# Patient Record
Sex: Female | Born: 1955
Health system: Southern US, Community
[De-identification: ages and names within clinical notes are randomized; demographics above are authoritative.]

## PROBLEM LIST (undated history)

## (undated) HISTORY — PX: TUBAL LIGATION: SHX77

## (undated) HISTORY — PX: APPENDECTOMY: SHX54

---

## 2009-05-01 ENCOUNTER — Ambulatory Visit: Payer: Self-pay | Admitting: Ophthalmology

## 2010-05-14 ENCOUNTER — Ambulatory Visit: Payer: Self-pay | Admitting: Ophthalmology

## 2010-05-28 ENCOUNTER — Ambulatory Visit: Payer: Self-pay | Admitting: Ophthalmology

## 2010-08-22 ENCOUNTER — Ambulatory Visit: Payer: Self-pay | Admitting: Ophthalmology

## 2011-12-31 ENCOUNTER — Ambulatory Visit: Payer: Self-pay | Admitting: Family Medicine

## 2013-12-27 IMAGING — CR RIGHT FOOT COMPLETE - 3+ VIEW
1 series · 3 of 3 positions shown · non-contrast
Comparison: none

REASON FOR EXAM: pain s/p fall
COMMENTS:

[Series 1: ap · 0.17mm/px · 3 of 3 slices shown]
[im 1/3]
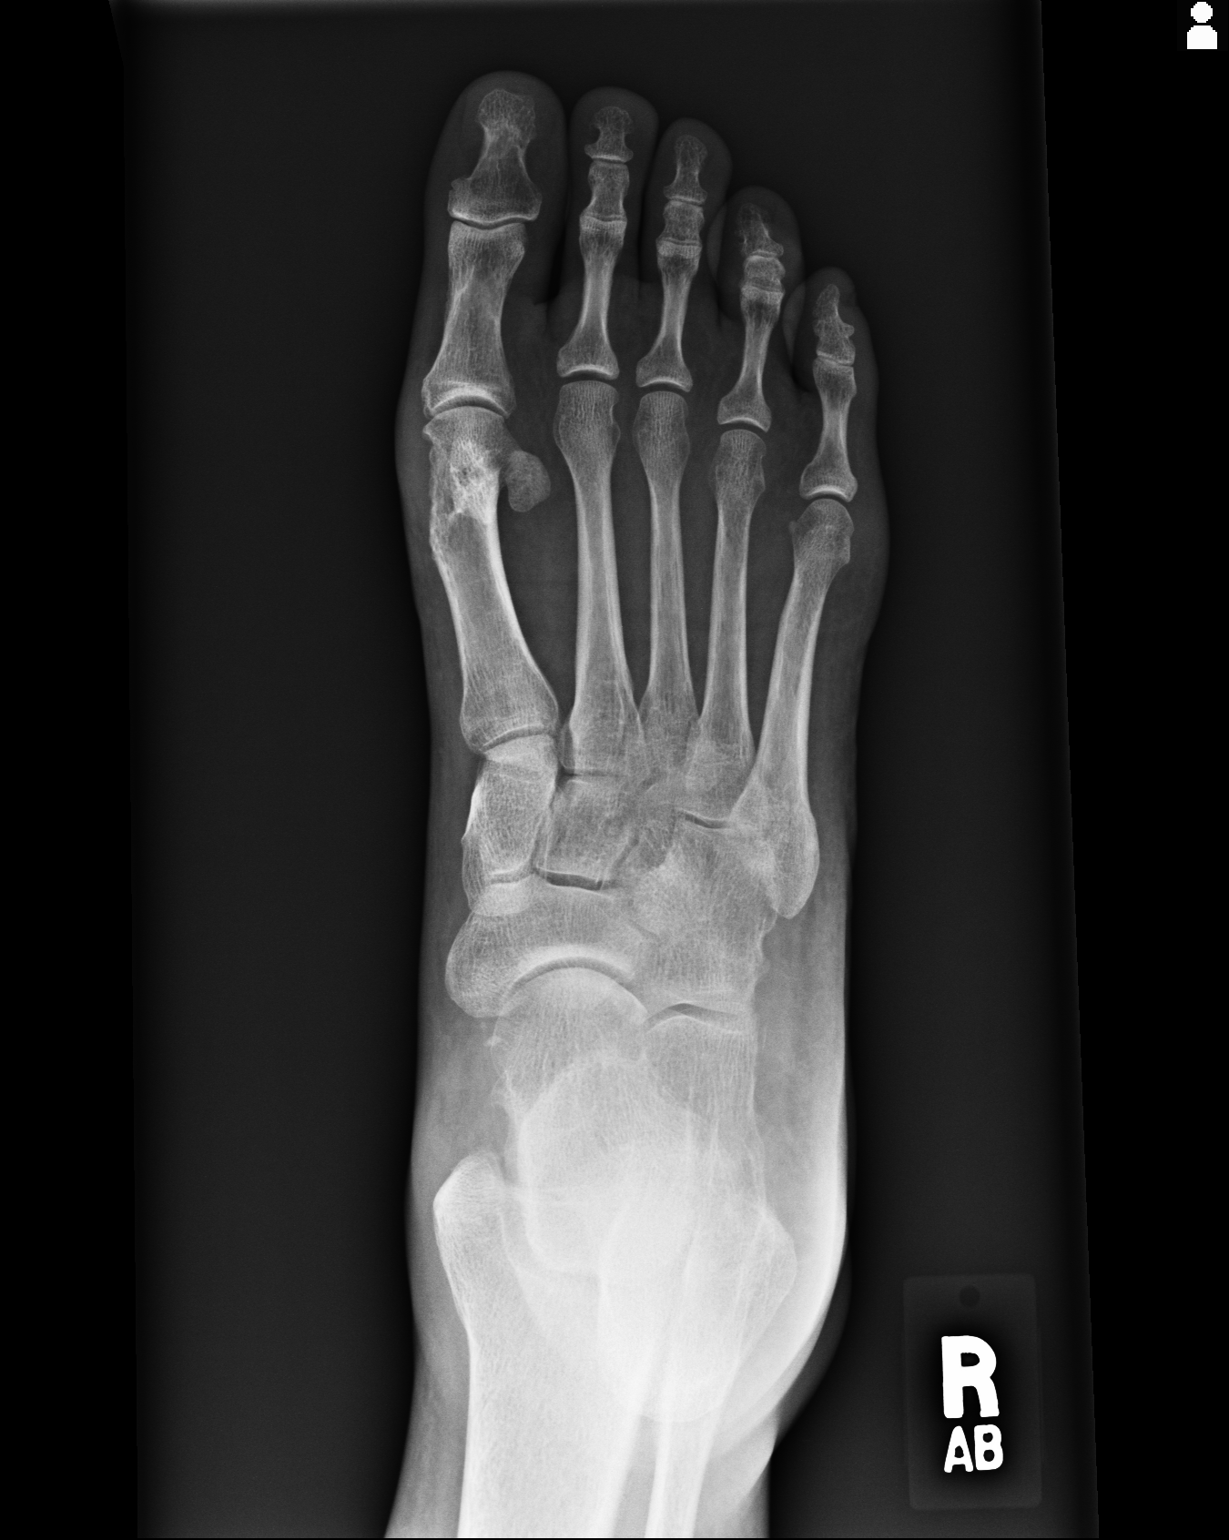
[im 2/3]
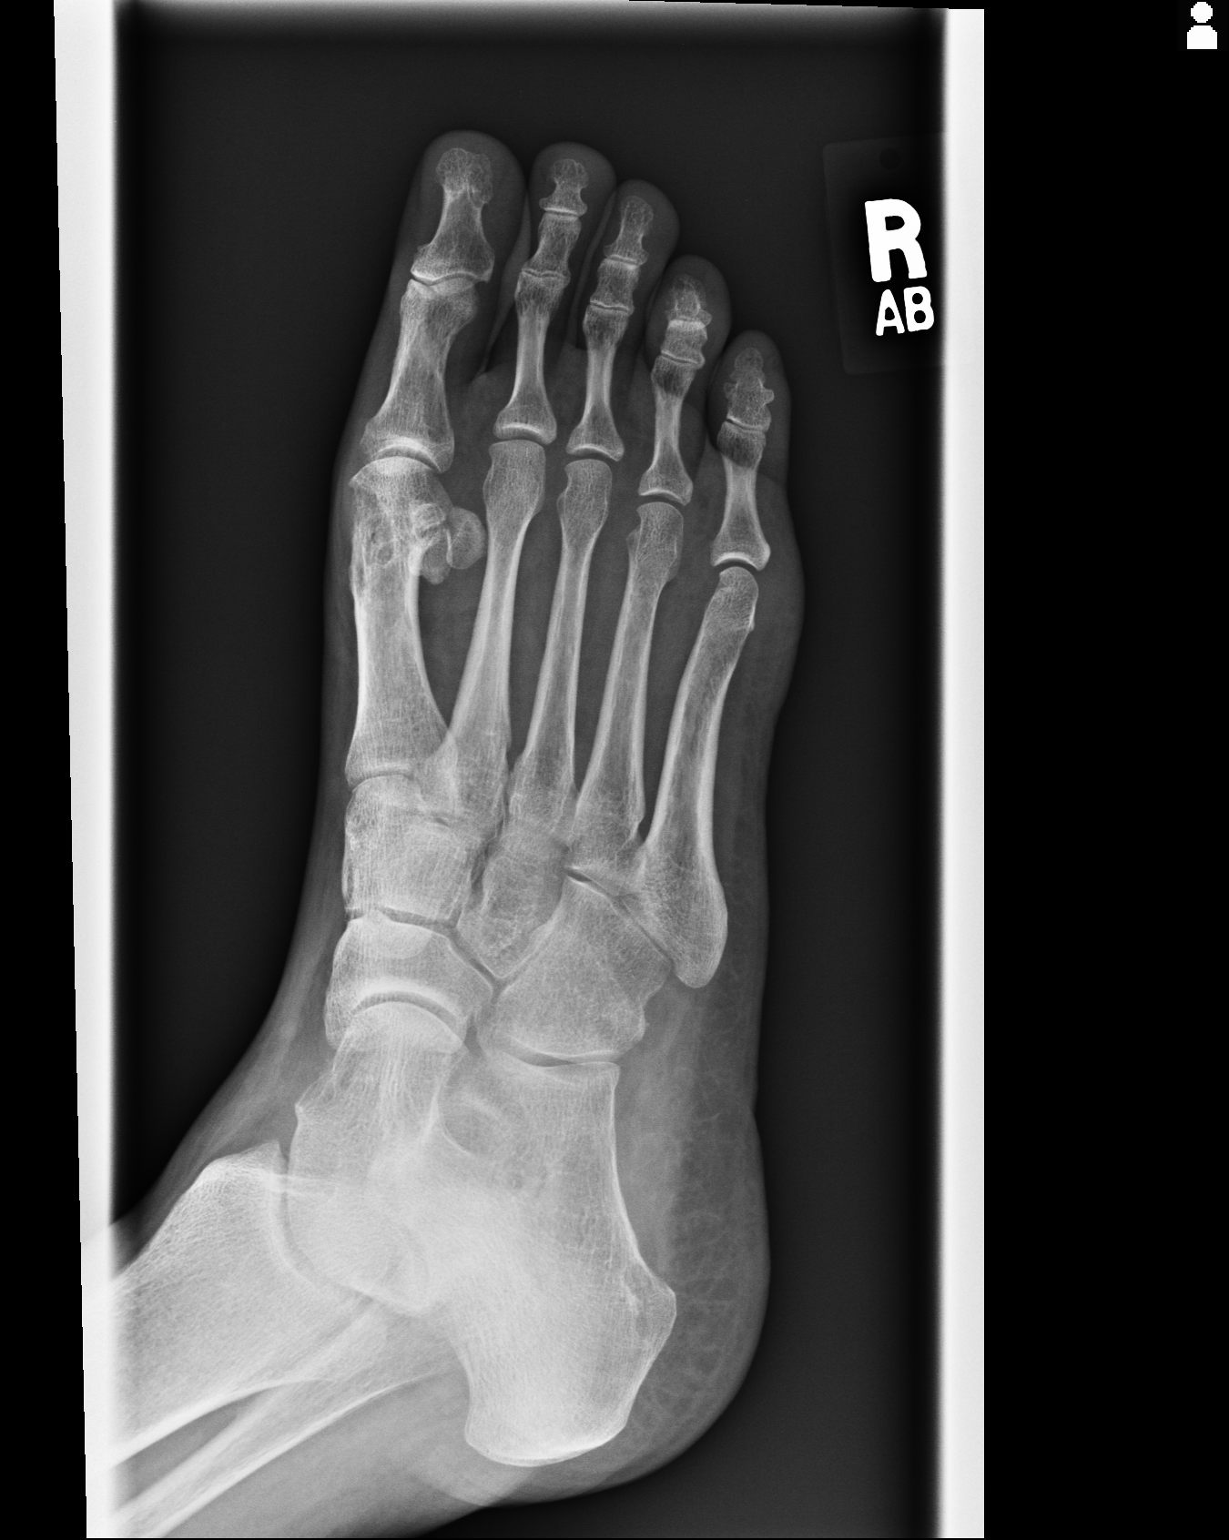
[im 3/3]
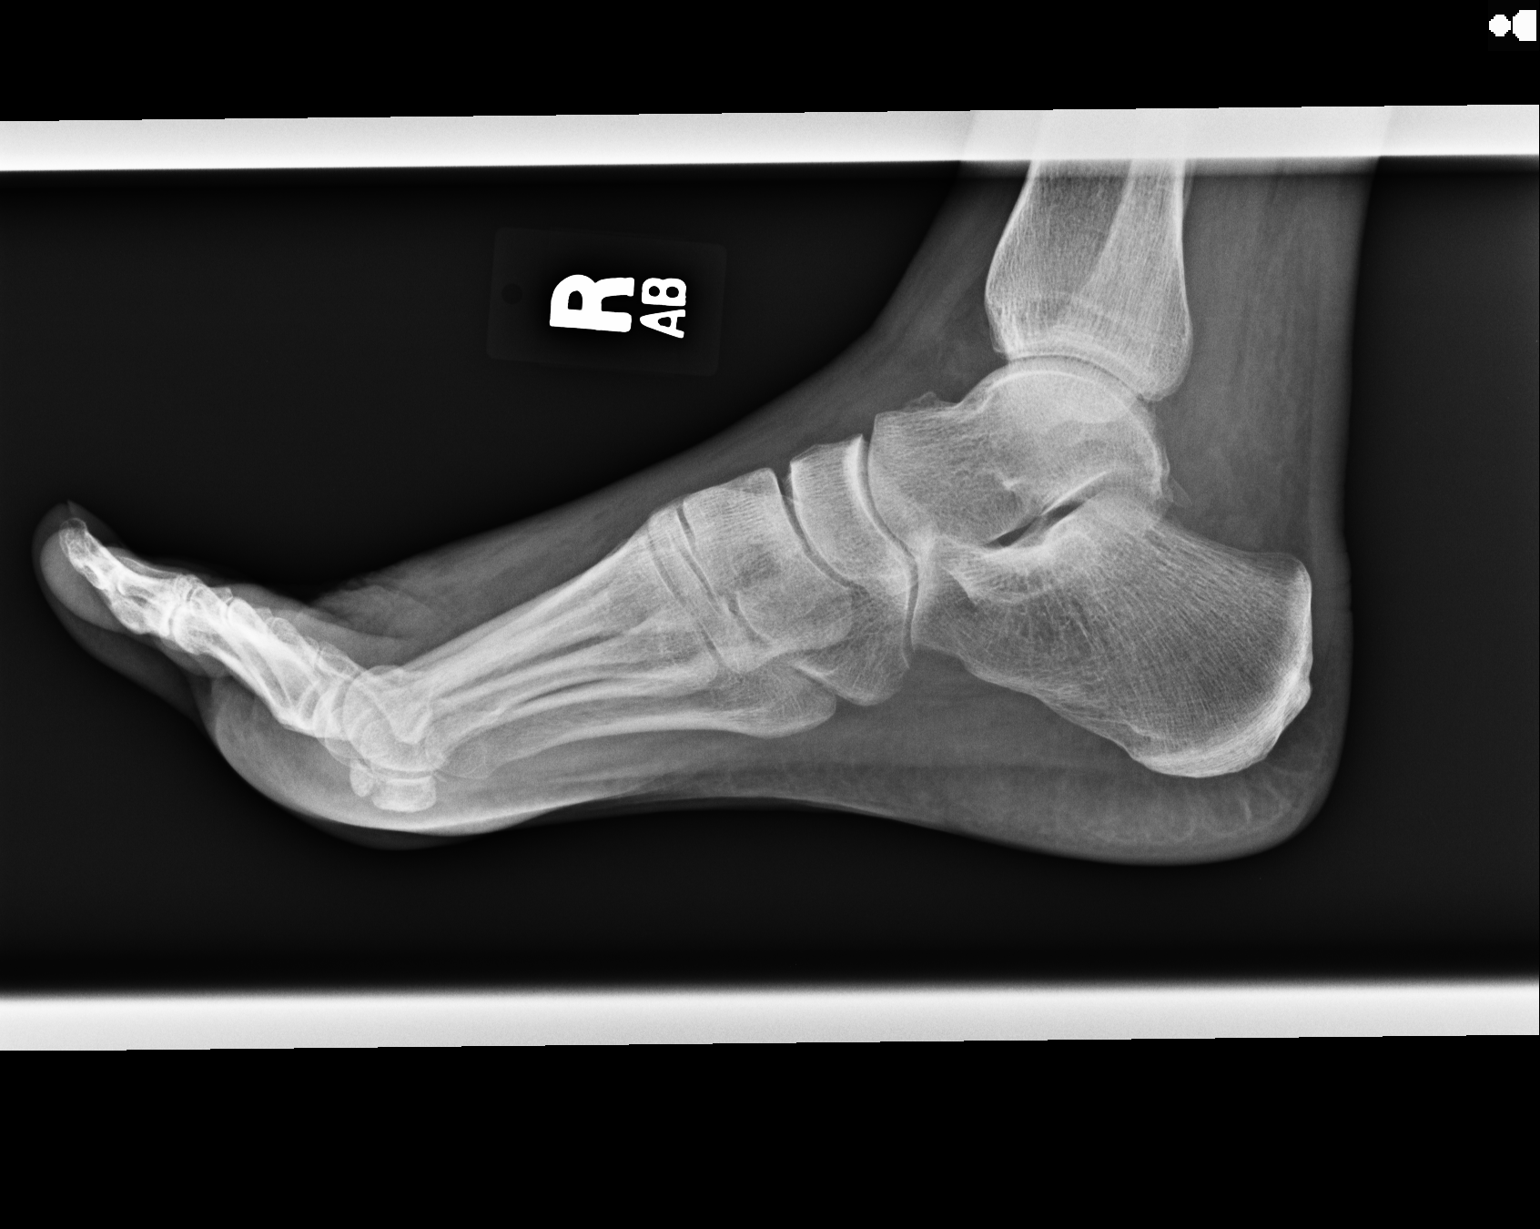

[3 of 3 positions shown; findings below may reference images not displayed]

PROCEDURE:     MDR - MDR FOOT RT COMP W/OBLIQUES  - December 31, 2011 [DATE]

RESULT:     No fracture, dislocation or other acute bony abnormality is
identified. Post surgical deformity of the medial aspect of the distal
portion of the first metatarsal is evident. No soft tissue foreign body is
seen.
IMPRESSION: 1.  No acute bony abnormalities are identified.
2.  There is post surgical deformity of the medial distal cortical margin of
the great toe.

[REDACTED]

## 2015-11-03 ENCOUNTER — Encounter: Payer: Self-pay | Admitting: Gynecology

## 2015-11-03 ENCOUNTER — Ambulatory Visit
Admission: EM | Admit: 2015-11-03 | Discharge: 2015-11-03 | Disposition: A | Discharge status: Home / Self Care | Payer: 59 | Attending: Family Medicine | Admitting: Family Medicine

## 2015-11-03 DIAGNOSIS — M7989 Other specified soft tissue disorders: Secondary | ICD-10-CM | POA: Diagnosis not present

## 2015-11-03 DIAGNOSIS — L089 Local infection of the skin and subcutaneous tissue, unspecified: Secondary | ICD-10-CM

## 2015-11-03 DIAGNOSIS — S60511A Abrasion of right hand, initial encounter: Secondary | ICD-10-CM | POA: Diagnosis not present

## 2015-11-03 DIAGNOSIS — S50811A Abrasion of right forearm, initial encounter: Secondary | ICD-10-CM | POA: Diagnosis not present

## 2015-11-03 DIAGNOSIS — L03113 Cellulitis of right upper limb: Secondary | ICD-10-CM | POA: Diagnosis not present

## 2015-11-03 DIAGNOSIS — S61451A Open bite of right hand, initial encounter: Secondary | ICD-10-CM | POA: Diagnosis not present

## 2015-11-03 DIAGNOSIS — R21 Rash and other nonspecific skin eruption: Secondary | ICD-10-CM | POA: Diagnosis not present

## 2015-11-03 DIAGNOSIS — S61031A Puncture wound without foreign body of right thumb without damage to nail, initial encounter: Secondary | ICD-10-CM | POA: Diagnosis not present

## 2015-11-03 DIAGNOSIS — Z23 Encounter for immunization: Secondary | ICD-10-CM | POA: Diagnosis not present

## 2015-11-03 DIAGNOSIS — S61431A Puncture wound without foreign body of right hand, initial encounter: Secondary | ICD-10-CM | POA: Diagnosis not present

## 2015-11-03 DIAGNOSIS — W5501XA Bitten by cat, initial encounter: Secondary | ICD-10-CM

## 2015-11-03 MED ORDER — MUPIROCIN 2 % EX OINT: 1.0000 "application " | TOPICAL_OINTMENT | Freq: Three times a day (TID) | CUTANEOUS | Status: DC

## 2015-11-03 MED ORDER — CEFTRIAXONE SODIUM 1 G IJ SOLR
1.0000 g | Freq: Once | INTRAMUSCULAR | Status: AC
  Administered 2015-11-03: 1 g via INTRAMUSCULAR

## 2015-11-03 MED ORDER — AMOXICILLIN-POT CLAVULANATE 875-125 MG PO TABS: 1.0000 | ORAL_TABLET | Freq: Two times a day (BID) | ORAL | Status: DC

## 2015-11-03 MED ORDER — TETANUS-DIPHTH-ACELL PERTUSSIS 5-2.5-18.5 LF-MCG/0.5 IM SUSP
0.5000 mL | Freq: Once | INTRAMUSCULAR | Status: AC
  Administered 2015-11-03: 0.5 mL via INTRAMUSCULAR

## 2015-11-03 NOTE — ED Provider Notes (Signed)
CSN: [id number]     Arrival date & time 11/03/15  1208 History   First MD Initiated Contact with Patient 11/03/15 [phone]    Nurses notes were reviewed.  Chief Complaint  Patient presents with  . Animal Bite    Patient well-known to [place] was bitten by one of the neighborhoods [place]. Apparently she states she reached down to pick up a cat and she thinks she stalled. Responded and bit her on the palmar surface of the right hand 2 days ago. Since she's had redness and swelling of the whole hand with redness. She is afebrile but there is definitely redness over the knuckles swelling of the hand and swelling over the animal bite as well.  No known medical problems. No known allergies to antibiotics and she does smoke. No pertinent family medical problems or issues either.  (Consider location/radiation/quality/duration/timing/severity/associated sxs/prior Treatment) Patient is a 60 y.o. female presenting with animal bite. The history is provided by the patient. No language interpreter was used.  Animal Bite Contact animal:  Cat Location:  Hand Time since incident:  2 days Pain details:    Quality:  Sharp and burning   Severity:  Moderate   Timing:  Constant   Progression:  Worsening Incident location:  Home Provoked: provoked   Notifications:  Animal control (Will notify animal control today) Animal's rabies vaccination status:  Unknown Animal in possession: no   Tetanus status:  Out of date Relieved by:  Cold compresses Ineffective treatments:  Cold compresses Associated symptoms: rash and swelling     History reviewed. No pertinent past medical history. Past Surgical History  Procedure Laterality Date  . Appendectomy    . Tubal ligation     No family history on file. Social History  Substance Use Topics  . Smoking status: Current Every Day Smoker -- 0.50 packs/day    Types: Cigarettes  . Smokeless tobacco: None  . Alcohol Use: No   OB History    No data available     Review  of Systems  Skin: Positive for rash and wound.  All other systems reviewed and are negative.   Allergies  Review of patient's allergies indicates no known allergies.  Home Medications   Prior to Admission medications   Medication Sig Start Date End Date Taking? Authorizing Provider  amoxicillin-clavulanate (AUGMENTIN) 875-125 MG tablet Take 1 tablet by mouth 2 (two) times daily. 11/03/15   Frederich Cha, MD  mupirocin ointment (BACTROBAN) 2 % Apply 1 application topically 3 (three) times daily. 11/03/15   Frederich Cha, MD   Meds Ordered and Administered this Visit   Medications  cefTRIAXone (ROCEPHIN) injection 1 g (1 g Intramuscular Given 11/03/15 1315)  Tdap (BOOSTRIX) injection 0.5 mL (0.5 mLs Intramuscular Given 11/03/15 1317)    BP 113/62 mmHg  Pulse 68  Temp(Src) 98 F (36.7 C) (Oral)  Ht 5\' 10"  (1.778 m)  Wt 127 lb (57.607 kg)  BMI 18.22 kg/m2  SpO2 100% No data found.   Physical Exam  Constitutional: She is oriented to person, place, and time. She appears well-developed and well-nourished.  HENT:  Head: Normocephalic and atraumatic.  Musculoskeletal: She exhibits tenderness.       Right shoulder: She exhibits tenderness, swelling, laceration and pain.       Right hand: She exhibits decreased range of motion, tenderness and swelling.       Hands: There is nothing fluctuant but there hallway hand is swollen is obviously more redness over the bite site and there  is redness over the right as well.  Neurological: She is alert and oriented to person, place, and time.  Skin: Rash noted. There is erythema.  Psychiatric: She has a normal mood and affect.  Vitals reviewed.   ED Course  Procedures (including critical care time)  Labs Review Labs Reviewed - No data to display  Imaging Review No results found.   Visual Acuity Review  Right Eye Distance:   Left Eye Distance:   Bilateral Distance:    Right Eye Near:   Left Eye Near:    Bilateral Near:          MDM   1. Animal bite of right hand with infection, initial encounter   2. Cat bite of hand, right, initial encounter      Bite with infection. Will give a gram of Rocephin IM place on Augmentin 875 one tablet twice a day Bactroban ointment over the wound. We'll contact animal control and see in follow the suggestions at this time concern with the patient is going to have to start rabies prophylaxis if this cat cannot be caught and watched.  We will contact animal control I'm recommending rabies immunization the patient's informed staff she does want any rabies vaccination if we cannot catch Will discuss this more with her and definitely have come back Sunday for follow-up.   Note: This dictation was prepared with Dragon dictation along with smaller phrase technology. Any transcriptional errors that result from this process are unintentional.  Frederich Cha, MD 11/03/15 [phone]

## 2015-11-03 NOTE — ED Notes (Signed)
Patient stated x 2 days right hand cat bite . Patient right hand swollen / redness and warm to the touch.

## 2015-11-03 NOTE — Discharge Instructions (Signed)
Cellulitis Cellulitis is an infection of the skin and the tissue under the skin. The infected area is usually red and tender. This happens most often in the arms and lower legs. HOME CARE   Take your antibiotic medicine as told. Finish the medicine even if you start to feel better.  Keep the infected arm or leg raised (elevated).  Put a warm cloth on the area up to 4 times per day.  Only take medicines as told by your doctor.  Keep all doctor visits as told. GET HELP IF:  You see red streaks on the skin coming from the infected area.  Your red area gets bigger or turns a dark color.  Your bone or joint under the infected area is painful after the skin heals.  Your infection comes back in the same area or different area.  You have a puffy (swollen) bump in the infected area.  You have new symptoms.  You have a fever. GET HELP RIGHT AWAY IF:   You feel very sleepy.  You throw up (vomit) or have watery poop (diarrhea).  You feel sick and have muscle aches and pains.   This information is not intended to replace advice given to you by your health care provider. Make sure you discuss any questions you have with your health care provider.   Document Released: 01/08/2008 Document Revised: 04/12/2015 Document Reviewed: 10/07/2011 Elsevier Interactive Patient Education 2016 [employer].  Conservator, museum/gallery bite wounds can get infected. It is important to get proper medical treatment. Ask your doctor if you need rabies treatment. HOME CARE  Wound Care  Follow instructions from your doctor about how to take care of your wound. Make sure you:  Wash your hands with soap and water before you change your bandage (dressing). If you cannot use soap and water, use hand sanitizer.  Change your bandage as told by your doctor.  Leave stitches (sutures), skin glue, or skin tape (adhesive) strips in place. They may need to stay in place for 2 weeks or longer. If tape strips get loose  and curl up, you may trim the loose edges. Do not remove tape strips completely unless your doctor says it is okay.  Check your wound every day for signs of infection. Watch for:    Redness, swelling, or pain that gets worse.    Fluid, blood, or pus.  General Instructions  Take or apply over-the-counter and prescription medicines only as told by your doctor.   If you were prescribed an antibiotic, take or apply it as told by your doctor. Do not stop using the antibiotic even if your condition improves.   Keep the injured area raised (elevated) above the level of your heart while you are sitting or lying down.  If directed, apply ice to the injured area.    Put ice in a plastic bag.    Place a towel between your skin and the bag.    Leave the ice on for 20 minutes, 2-3 times per day.   Keep all follow-up visits as told by your doctor. This is important.  GET HELP IF:  You have redness, swelling, or pain that gets worse.   You have a general feeling of sickness (malaise).   You feel sick to your stomach (nauseous).  You throw up (vomit).   You have pain that does not get better.  GET HELP RIGHT AWAY IF:   You have a red streak going away from your wound.  You have fluid, blood, or pus coming from your wound.   You have a fever or chills.   You have trouble moving your injured area.   You have numbness or tingling anywhere on your body.    This information is not intended to replace advice given to you by your health care provider. Make sure you discuss any questions you have with your health care provider.   Document Released: 07/22/2005 Document Revised: 04/12/2015 Document Reviewed: 12/07/2014 Elsevier Interactive Patient Education [employer].

## 2015-11-06 DIAGNOSIS — Z23 Encounter for immunization: Secondary | ICD-10-CM | POA: Diagnosis not present

## 2015-11-10 DIAGNOSIS — Z23 Encounter for immunization: Secondary | ICD-10-CM | POA: Diagnosis not present

## 2016-11-26 ENCOUNTER — Ambulatory Visit
Admission: EM | Admit: 2016-11-26 | Discharge: 2016-11-26 | Disposition: A | Discharge status: Home / Self Care | Payer: 59 | Attending: Family Medicine | Admitting: Family Medicine

## 2016-11-26 DIAGNOSIS — R05 Cough: Secondary | ICD-10-CM | POA: Diagnosis not present

## 2016-11-26 DIAGNOSIS — R059 Cough, unspecified: Secondary | ICD-10-CM

## 2016-11-26 DIAGNOSIS — J40 Bronchitis, not specified as acute or chronic: Secondary | ICD-10-CM | POA: Diagnosis not present

## 2016-11-26 MED ORDER — HYDROCOD POLST-CPM POLST ER 10-8 MG/5ML PO SUER: 5.0000 mL | Freq: Every evening | ORAL | 0 refills | Status: DC | PRN

## 2016-11-26 MED ORDER — AZITHROMYCIN 250 MG PO TABS: ORAL_TABLET | ORAL | 0 refills | Status: DC

## 2016-11-26 NOTE — ED Triage Notes (Signed)
Pt with 5 days of cough, congestion, bodyaches. Unsure if fever but has had chills. Pain 5/10

## 2016-11-26 NOTE — ED Provider Notes (Signed)
[facility]    CSN: [id number] Arrival date & time: 11/26/16  1434     History   Chief Complaint Chief Complaint  Patient presents with  . Cough  . Generalized Body Aches    HPI Tara Barnes is a 61 y.o. female.   The history is provided by the patient.  Cough  Associated symptoms: rhinorrhea   Associated symptoms: no headaches, no myalgias and no wheezing   URI  Presenting symptoms: congestion, cough, fatigue and rhinorrhea   Severity:  Moderate Onset quality:  Sudden Duration:  5 days Timing:  Constant Progression:  Worsening Chronicity:  New Relieved by: guaifenesin. Associated symptoms: no arthralgias, no headaches, no myalgias, no neck pain, no sinus pain, no swollen glands and no wheezing   Risk factors: not elderly, no chronic cardiac disease, no chronic kidney disease, no diabetes mellitus, no immunosuppression, no recent illness and no recent travel  Chronic respiratory disease: unknown; chronic smoker for over 40 yrs.     History reviewed. No pertinent past medical history.  There are no active problems to display for this patient.   Past Surgical History:  Procedure Laterality Date  . APPENDECTOMY    . TUBAL LIGATION      OB History    No data available       Home Medications    Prior to Admission medications   Medication Sig Start Date End Date Taking? Authorizing Provider  amoxicillin-clavulanate (AUGMENTIN) 875-125 MG tablet Take 1 tablet by mouth 2 (two) times daily. 11/03/15   Frederich Cha, MD  azithromycin (ZITHROMAX Z-PAK) 250 MG tablet 2 tabs po once day 1, then 1 tab po qd for next 4 days 11/26/16   Norval Gable, MD  chlorpheniramine-HYDROcodone [facility] ER) 10-8 MG/5ML SUER Take 5 mLs by mouth at bedtime as needed. 11/26/16   Norval Gable, MD  mupirocin ointment (BACTROBAN) 2 % Apply 1 application topically 3 (three) times daily. 11/03/15   Frederich Cha, MD    Family History History reviewed. No pertinent  family history.  Social History Social History  Substance Use Topics  . Smoking status: Current Every Day Smoker    Packs/day: 0.50    Types: Cigarettes  . Smokeless tobacco: Never Used  . Alcohol use No     Allergies   Patient has no known allergies.   Review of Systems Review of Systems  Constitutional: Positive for fatigue.  HENT: Positive for congestion and rhinorrhea. Negative for sinus pain.   Respiratory: Positive for cough. Negative for wheezing.   Musculoskeletal: Negative for arthralgias, myalgias and neck pain.  Neurological: Negative for headaches.     Physical Exam Triage Vital Signs ED Triage Vitals  Enc Vitals Group     BP 11/26/16 1455 (!) 132/58     Pulse Rate 11/26/16 1455 61     Resp 11/26/16 1455 16     Temp 11/26/16 1455 98.5 F (36.9 C)     Temp Source 11/26/16 1455 Oral     SpO2 11/26/16 1455 99 %     Weight --      Height 11/26/16 1454 5\' 10"  (1.778 m)     Head Circumference --      Peak Flow --      Pain Score 11/26/16 1456 5     Pain Loc --      Pain Edu? --      Excl. in [place]? --    No data found.   Updated Vital Signs BP [facility] Kitchen)  132/58 (BP Location: Left Arm)   Pulse 61   Temp 98.5 F (36.9 C) (Oral)   Resp 16   Ht 5\' 10"  (1.778 m)   SpO2 99%   Visual Acuity Right Eye Distance:   Left Eye Distance:   Bilateral Distance:    Right Eye Near:   Left Eye Near:    Bilateral Near:     Physical Exam  Constitutional: She appears well-developed and well-nourished. No distress.  HENT:  Head: Normocephalic and atraumatic.  Right Ear: Tympanic membrane, external ear and ear canal normal.  Left Ear: Tympanic membrane, external ear and ear canal normal.  Nose: Rhinorrhea present. No mucosal edema, nose lacerations, sinus tenderness, nasal deformity, septal deviation or nasal septal hematoma. No epistaxis.  No foreign bodies. Right sinus exhibits no maxillary sinus tenderness and no frontal sinus tenderness. Left sinus exhibits no  maxillary sinus tenderness and no frontal sinus tenderness.  Mouth/Throat: Uvula is midline, oropharynx is clear and moist and mucous membranes are normal. No oropharyngeal exudate.  Eyes: Conjunctivae and EOM are normal. Pupils are equal, round, and reactive to light. Right eye exhibits no discharge. Left eye exhibits no discharge. No scleral icterus.  Neck: Normal range of motion. Neck supple. No thyromegaly present.  Cardiovascular: Normal rate, regular rhythm and normal heart sounds.   Pulmonary/Chest: Effort normal. No respiratory distress. She has no wheezes. She has no rales.  Diffuse rhonchi  Lymphadenopathy:    She has no cervical adenopathy.  Skin: She is not diaphoretic.  Nursing note and vitals reviewed.    UC Treatments / Results  Labs (all labs ordered are listed, but only abnormal results are displayed) Labs Reviewed - No data to display  EKG  EKG Interpretation None       Radiology No results found.  Procedures Procedures (including critical care time)  Medications Ordered in UC Medications - No data to display   Initial Impression / Assessment and Plan / UC Course  I have reviewed the triage vital signs and the nursing notes.  Pertinent labs & imaging results that were available during my care of the patient were reviewed by me and considered in my medical decision making (see chart for details).       Final Clinical Impressions(s) / UC Diagnoses   Final diagnoses:  Cough  Bronchitis    New Prescriptions New Prescriptions   AZITHROMYCIN (ZITHROMAX Z-PAK) 250 MG TABLET    2 tabs po once day 1, then 1 tab po qd for next 4 days   CHLORPHENIRAMINE-HYDROCODONE (TUSSIONEX PENNKINETIC ER) 10-8 MG/5ML SUER    Take 5 mLs by mouth at bedtime as needed.   1. diagnosis reviewed with patient 2. rx as per orders above; reviewed possible side effects, interactions, risks and benefits  3. Recommend supportive treatment with rest, fluids 4. Follow-up prn if  symptoms worsen or don't improve   Norval Gable, MD 11/26/16 1530

## 2017-11-06 ENCOUNTER — Ambulatory Visit: Payer: 59 | Admitting: Family Medicine

## 2017-11-06 ENCOUNTER — Encounter: Payer: Self-pay | Admitting: Family Medicine

## 2017-11-06 VITALS — BP 120/84 | HR 68 | Ht 70.0 in | Wt 132.0 lb

## 2017-11-06 DIAGNOSIS — Z1231 Encounter for screening mammogram for malignant neoplasm of breast: Secondary | ICD-10-CM

## 2017-11-06 DIAGNOSIS — Z1239 Encounter for other screening for malignant neoplasm of breast: Secondary | ICD-10-CM

## 2017-11-06 DIAGNOSIS — Z0000 Encounter for general adult medical examination without abnormal findings: Secondary | ICD-10-CM | POA: Diagnosis not present

## 2017-11-06 DIAGNOSIS — Z1211 Encounter for screening for malignant neoplasm of colon: Secondary | ICD-10-CM

## 2017-11-06 NOTE — Progress Notes (Signed)
Name: Tara Barnes   MRN: [id number]    DOB: 1956-05-11   Date:11/06/2017       Progress Note  Subjective  Chief Complaint  Chief Complaint  Patient presents with  . Establish Care    needed a PCP  . Employment Physical    Patient presents for annual physical exam.   No problem-specific Assessment & Plan notes found for this encounter.   History reviewed. No pertinent past medical history.  Past Surgical History:  Procedure Laterality Date  . APPENDECTOMY    . TUBAL LIGATION      Family History  Problem Relation Age of Onset  . Cancer Father     Social History   Socioeconomic History  . Marital status: Married    Spouse name: Not on file  . Number of children: Not on file  . Years of education: Not on file  . Highest education level: Not on file  Occupational History  . Not on file  Social Needs  . Financial resource strain: Not on file  . Food insecurity:    Worry: Not on file    Inability: Not on file  . Transportation needs:    Medical: Not on file    Non-medical: Not on file  Tobacco Use  . Smoking status: Former Smoker    Packs/day: 0.50    Types: Cigarettes    Last attempt to quit: 08/05/2017    Years since quitting: 0.2  . Smokeless tobacco: Never Used  Substance and Sexual Activity  . Alcohol use: Yes  . Drug use: No  . Sexual activity: Yes  Lifestyle  . Physical activity:    Days per week: Not on file    Minutes per session: Not on file  . Stress: Not on file  Relationships  . Social connections:    Talks on phone: Not on file    Gets together: Not on file    Attends religious service: Not on file    Active member of club or organization: Not on file    Attends meetings of clubs or organizations: Not on file    Relationship status: Not on file  . Intimate partner violence:    Fear of current or ex partner: Not on file    Emotionally abused: Not on file    Physically abused: Not on file    Forced sexual activity: Not on file  Other  Topics Concern  . Not on file  Social History Narrative  . Not on file    No Known Allergies  Outpatient Medications Prior to Visit  Medication Sig Dispense Refill  . amoxicillin-clavulanate (AUGMENTIN) 875-125 MG tablet Take 1 tablet by mouth 2 (two) times daily. 20 tablet 0  . azithromycin (ZITHROMAX Z-PAK) 250 MG tablet 2 tabs po once day 1, then 1 tab po qd for next 4 days 6 each 0  . chlorpheniramine-HYDROcodone (TUSSIONEX PENNKINETIC ER) 10-8 MG/5ML SUER Take 5 mLs by mouth at bedtime as needed. 100 mL 0  . mupirocin ointment (BACTROBAN) 2 % Apply 1 application topically 3 (three) times daily. 22 g 0   No facility-administered medications prior to visit.     Review of Systems  Constitutional: Negative for chills, diaphoresis, fever, malaise/fatigue and weight loss.  HENT: Negative for congestion, ear discharge, ear pain, hearing loss, nosebleeds, sinus pain, sore throat and tinnitus.   Eyes: Negative for blurred vision, double vision, photophobia, pain, discharge and redness.  Respiratory: Negative for cough, hemoptysis, sputum production, shortness of  breath, wheezing and stridor.   Cardiovascular: Negative for chest pain, palpitations, orthopnea, claudication, leg swelling and PND.  Gastrointestinal: Negative for abdominal pain, blood in stool, constipation, diarrhea, heartburn, melena, nausea and vomiting.  Genitourinary: Negative for dysuria, flank pain, frequency, hematuria and urgency.  Musculoskeletal: Negative for back pain, falls, joint pain, myalgias and neck pain.  Skin: Negative for itching and rash.  Neurological: Negative for dizziness, tingling, tremors, sensory change, speech change, focal weakness, seizures, weakness and headaches.  Endo/Heme/Allergies: Negative for environmental allergies and polydipsia. Does not bruise/bleed easily.  Psychiatric/Behavioral: Negative for depression and suicidal ideas. The patient is not nervous/anxious and does not have insomnia.       Objective  Vitals:   11/06/17 1433  BP: 120/84  Pulse: 68  Weight: 132 lb (59.9 kg)  Height: 5\' 10"  (1.778 m)    Physical Exam  Constitutional: She is oriented to person, place, and time and well-developed, well-nourished, and in no distress. No distress.  HENT:  Head: Normocephalic and atraumatic.  Right Ear: Hearing, tympanic membrane, external ear and ear canal normal.  Left Ear: Hearing, tympanic membrane, external ear and ear canal normal.  Nose: Nose normal.  Mouth/Throat: Uvula is midline, oropharynx is clear and moist and mucous membranes are normal.  Eyes: Pupils are equal, round, and reactive to light. Conjunctivae, EOM and lids are normal. Right eye exhibits no discharge. Left eye exhibits no discharge. No scleral icterus.  Fundoscopic exam:      The right eye shows no arteriolar narrowing and no AV nicking.       The left eye shows no arteriolar narrowing and no AV nicking.  Neck: Trachea normal and normal range of motion. Neck supple. Normal carotid pulses, no hepatojugular reflux and no JVD present. No tracheal tenderness present. Carotid bruit is not present. No neck rigidity. No tracheal deviation, no edema, no erythema and normal range of motion present. No thyroid mass and no thyromegaly present.  Cardiovascular: Normal rate, regular rhythm, S1 normal, S2 normal, normal heart sounds, intact distal pulses and normal pulses. PMI is not displaced. Exam reveals no gallop, no S3, no S4, no friction rub and no decreased pulses.  No murmur heard. Pulses:      Carotid pulses are 2+ on the right side, and 2+ on the left side.      Radial pulses are 2+ on the right side, and 2+ on the left side.       Femoral pulses are 2+ on the right side, and 2+ on the left side.      Popliteal pulses are 2+ on the right side, and 2+ on the left side.       Dorsalis pedis pulses are 2+ on the right side, and 2+ on the left side.       Posterior tibial pulses are 2+ on the right  side, and 2+ on the left side.  Pulmonary/Chest: Effort normal and breath sounds normal. No accessory muscle usage or stridor. No respiratory distress. She has no decreased breath sounds. She has no wheezes. She has no rhonchi. She has no rales. She exhibits no tenderness. Right breast exhibits no inverted nipple, no mass, no nipple discharge, no skin change and no tenderness. Left breast exhibits no inverted nipple, no mass, no nipple discharge, no skin change and no tenderness. Breasts are symmetrical.  Abdominal: Soft. Bowel sounds are normal. She exhibits no distension and no mass. There is no hepatosplenomegaly. There is no tenderness. There is no rigidity,  no rebound, no guarding and no CVA tenderness. No hernia.  Genitourinary:  Genitourinary Comments: deferred  Musculoskeletal: Normal range of motion. She exhibits no edema or tenderness.       Cervical back: Normal.       Thoracic back: Normal.       Lumbar back: Normal.  Lymphadenopathy:       Head (right side): No submandibular adenopathy present.       Head (left side): No submandibular adenopathy present.    She has no cervical adenopathy.       Right cervical: No superficial cervical adenopathy present.      Left cervical: No superficial cervical adenopathy present.    She has no axillary adenopathy.  Neurological: She is alert and oriented to person, place, and time. She has normal motor skills, normal sensation, normal strength, normal reflexes and intact cranial nerves.  Skin: Skin is warm and dry. She is not diaphoretic. No pallor.  Psychiatric: Mood and affect normal.  Nursing note and vitals reviewed.     Assessment & Plan  Problem List Items Addressed This Visit    None    Visit Diagnoses    Annual physical exam    -  Primary   Relevant Orders   CBC   Lipid Panel With LDL/HDL Ratio   Comprehensive metabolic panel   Breast cancer screening       Relevant Orders   MM Digital Screening   Colon cancer screening        Relevant Orders   Ambulatory referral to Gastroenterology      No orders of the defined types were placed in this encounter. ADHIRA JAMIL is a 62 y.o. female who presents today for her Complete Annual Exam. She feels well. She reports exercising . She reports she is sleeping well.  Immunizations are reviewed and recommendations provided.   Age appropriate screening tests are discussed. Counseling given for risk factor reduction interventions.   Dr. [facility]  11/06/17

## 2017-11-07 LAB — COMPREHENSIVE METABOLIC PANEL
ALBUMIN: 4.4 g/dL (ref 3.6–4.8)
ALK PHOS: 58 IU/L (ref 39–117)
ALT: 14 IU/L (ref 0–32)
AST: 27 IU/L (ref 0–40)
Albumin/Globulin Ratio: 1.8 (ref 1.2–2.2)
BILIRUBIN TOTAL: 0.4 mg/dL (ref 0.0–1.2)
BUN / CREAT RATIO: 13 (ref 12–28)
BUN: 10 mg/dL (ref 8–27)
CHLORIDE: 102 mmol/L (ref 96–106)
CO2: 25 mmol/L (ref 20–29)
Calcium: 9.4 mg/dL (ref 8.7–10.3)
Creatinine, Ser: 0.76 mg/dL (ref 0.57–1.00)
GFR calc Af Amer: 96 mL/min/{1.73_m2} (ref >59)
GFR calc non Af Amer: 83 mL/min/{1.73_m2} (ref >59)
GLOBULIN, TOTAL: 2.5 g/dL (ref 1.5–4.5)
GLUCOSE: 97 mg/dL (ref 65–99)
Potassium: 4.4 mmol/L (ref 3.5–5.2)
SODIUM: 140 mmol/L (ref 134–144)
Total Protein: 6.9 g/dL (ref 6.0–8.5)

## 2017-11-07 LAB — CBC
HEMOGLOBIN: 14.1 g/dL (ref 11.1–15.9)
Hematocrit: 42.5 % (ref 34.0–46.6)
MCH: 32.6 pg (ref 26.6–33.0)
MCHC: 33.2 g/dL (ref 31.5–35.7)
MCV: 98 fL — AB (ref 79–97)
PLATELETS: 230 10*3/uL (ref 150–379)
RBC: 4.33 x10E6/uL (ref 3.77–5.28)
RDW: 13.5 % (ref 12.3–15.4)
WBC: 7.4 10*3/uL (ref 3.4–10.8)

## 2017-11-07 LAB — LIPID PANEL WITH LDL/HDL RATIO
CHOLESTEROL TOTAL: 173 mg/dL (ref 100–199)
HDL: 59 mg/dL (ref >39)
LDL CALC: 94 mg/dL (ref 0–99)
LDl/HDL Ratio: 1.6 ratio (ref 0.0–3.2)
Triglycerides: 100 mg/dL (ref 0–149)
VLDL CHOLESTEROL CAL: 20 mg/dL (ref 5–40)

## 2017-12-01 ENCOUNTER — Telehealth: Payer: Self-pay

## 2017-12-01 NOTE — Telephone Encounter (Signed)
Gastroenterology Pre-Procedure Review  Request Date: 05/13/18 Requesting Physician: Dr. Marius Ditch  PATIENT REVIEW QUESTIONS: The patient responded to the following health history questions as indicated:    1. Are you having any GI issues? No  2. Do you have a personal history of Polyps? No  3. Do you have a family history of Colon Cancer or Polyps? No  4. Diabetes Mellitus? No  5. Joint replacements in the past 12 months? No  6. Major health problems in the past 3 months? No  7. Any artificial heart valves, MVP, or defibrillator? No     MEDICATIONS & ALLERGIES:    Patient reports the following regarding taking any anticoagulation/antiplatelet therapy:   Plavix, Coumadin, Eliquis, Xarelto, Lovenox, Pradaxa, Brilinta, or Effient? No  Aspirin? No   Patient confirms/reports the following medications:  Current Outpatient Medications  Medication Sig Dispense Refill  . Multiple Vitamins-Calcium (ONE-A-DAY WOMENS PO) Take 1 tablet by mouth daily.     No current facility-administered medications for this visit.     Patient confirms/reports the following allergies:  No Known Allergies  No orders of the defined types were placed in this encounter.   AUTHORIZATION INFORMATION Primary Insurance: 1D#: Group #:  Secondary Insurance: 1D#: Group #:  SCHEDULE INFORMATION: Date: 05/13/18 Time: Location: [place]

## 2017-12-03 ENCOUNTER — Other Ambulatory Visit: Payer: Self-pay

## 2017-12-03 DIAGNOSIS — Z1211 Encounter for screening for malignant neoplasm of colon: Secondary | ICD-10-CM

## 2018-04-21 ENCOUNTER — Telehealth: Payer: Self-pay | Admitting: Gastroenterology

## 2018-04-21 NOTE — Telephone Encounter (Signed)
Patient has been contacted to see why she decided to cancel her colonoscopy with Dr. Bonna Gains.  She was not home.  Left message with family member for her to call office.   Thanks [employer]

## 2018-04-21 NOTE — Telephone Encounter (Signed)
PT  Would like to cancel her procedure she will call us back to r/s

## 2018-05-13 ENCOUNTER — Ambulatory Visit: Admit: 2018-05-13 | Payer: 59 | Admitting: Gastroenterology

## 2018-05-13 SURGERY — COLONOSCOPY WITH PROPOFOL
Anesthesia: General

## 2018-06-02 NOTE — Telephone Encounter (Signed)
Left message to contact office and reschedule colonoscopy appt

## 2018-06-03 NOTE — Telephone Encounter (Signed)
Unable to reach pt by phone. Letter sent.

## 2018-09-15 DIAGNOSIS — D485 Neoplasm of uncertain behavior of skin: Secondary | ICD-10-CM | POA: Diagnosis not present

## 2018-09-15 DIAGNOSIS — D0462 Carcinoma in situ of skin of left upper limb, including shoulder: Secondary | ICD-10-CM | POA: Diagnosis not present

## 2018-10-06 DIAGNOSIS — C44629 Squamous cell carcinoma of skin of left upper limb, including shoulder: Secondary | ICD-10-CM | POA: Diagnosis not present

## 2019-11-21 ENCOUNTER — Encounter: Payer: Self-pay | Admitting: Emergency Medicine

## 2019-11-21 ENCOUNTER — Ambulatory Visit
Admission: EM | Admit: 2019-11-21 | Discharge: 2019-11-21 | Disposition: A | Discharge status: Home / Self Care | Payer: Medicare Other | Attending: Family Medicine | Admitting: Family Medicine

## 2019-11-21 ENCOUNTER — Other Ambulatory Visit: Payer: Self-pay

## 2019-11-21 DIAGNOSIS — L255 Unspecified contact dermatitis due to plants, except food: Secondary | ICD-10-CM | POA: Diagnosis not present

## 2019-11-21 MED ORDER — PREDNISONE 10 MG PO TABS: ORAL_TABLET | ORAL | 0 refills | Status: DC

## 2019-11-21 NOTE — ED Provider Notes (Signed)
[facility]    CSN: [id number] Arrival date & time: 11/21/19  1324   History   Chief Complaint Chief Complaint  Patient presents with  . Rash   HPI  64 year old female presents with rash.  Patient has a raised, erythematous, itchy rash on her arms.  She states that she was recently pulling weeds in her yard on 4/12.  Patient believes that she is experiencing poison oak or poison ivy.  She has been applying calamine lotion without relief.  No exacerbating factors.  No other complaints.  Home Medications    Prior to Admission medications   Medication Sig Start Date End Date Taking? Authorizing Provider  predniSONE (DELTASONE) 10 MG tablet 50 mg daily x 3 days, then 40 mg daily x 3 days, then 30 mg daily x 3 days, then 20 mg daily x 3 days, then 10 mg daily x 3 days. 11/21/19   Coral Spikes, DO    Family History Family History  Problem Relation Age of Onset  . Cancer Father     Social History Social History   Tobacco Use  . Smoking status: Former Smoker    Packs/day: 0.50    Types: Cigarettes    Quit date: 08/05/2017    Years since quitting: 2.2  . Smokeless tobacco: Never Used  Substance Use Topics  . Alcohol use: Yes  . Drug use: No     Allergies   Patient has no known allergies.   Review of Systems Review of Systems  Constitutional: Negative.   Skin: Positive for rash.   Physical Exam Triage Vital Signs ED Triage Vitals  Enc Vitals Group     BP 11/21/19 1338 (!) 144/72     Pulse Rate 11/21/19 1338 65     Resp 11/21/19 1338 14     Temp 11/21/19 1338 98 F (36.7 C)     Temp Source 11/21/19 1338 Oral     SpO2 11/21/19 1338 99 %     Weight 11/21/19 1334 129 lb (58.5 kg)     Height 11/21/19 1334 5\' 10"  (1.778 m)     Head Circumference --      Peak Flow --      Pain Score 11/21/19 1334 0     Pain Loc --      Pain Edu? --      Excl. in [place]? --    Updated Vital Signs BP (!) 144/72 (BP Location: Left Arm)   Pulse 65   Temp 98 F (36.7  C) (Oral)   Resp 14   Ht 5\' 10"  (1.778 m)   Wt 58.5 kg   SpO2 99%   BMI 18.51 kg/m   Visual Acuity Right Eye Distance:   Left Eye Distance:   Bilateral Distance:    Right Eye Near:   Left Eye Near:    Bilateral Near:     Physical Exam Vitals and nursing note reviewed.  Constitutional:      General: She is not in acute distress.    Appearance: Normal appearance. She is not ill-appearing.  HENT:     Head: Normocephalic and atraumatic.  Eyes:     General:        Right eye: No discharge.        Left eye: No discharge.     Conjunctiva/sclera: Conjunctivae normal.  Pulmonary:     Effort: Pulmonary effort is normal. No respiratory distress.  Skin:    Comments: Erythematous, vesicular rash noted on the upper  extremities.  Neurological:     Mental Status: She is alert.  Psychiatric:        Mood and Affect: Mood normal.        Behavior: Behavior normal.    UC Treatments / Results  Labs (all labs ordered are listed, but only abnormal results are displayed) Labs Reviewed - No data to display  EKG   Radiology No results found.  Procedures Procedures (including critical care time)  Medications Ordered in UC Medications - No data to display  Initial Impression / Assessment and Plan / UC Course  I have reviewed the triage vital signs and the nursing notes.  Pertinent labs & imaging results that were available during my care of the patient were reviewed by me and considered in my medical decision making (see chart for details).     64 year old female presents with dermatitis secondary to plant.  Treating with prednisone.  Final Clinical Impressions(s) / UC Diagnoses   Final diagnoses:  Dermatitis due to plants, including poison ivy, sumac, and oak   Discharge Instructions   None    ED Prescriptions    Medication Sig Dispense Auth. Provider   predniSONE (DELTASONE) 10 MG tablet 50 mg daily x 3 days, then 40 mg daily x 3 days, then 30 mg daily x 3 days, then  20 mg daily x 3 days, then 10 mg daily x 3 days. 45 tablet Coral Spikes, DO     PDMP not reviewed this encounter.   Coral Spikes, [place] 11/21/19 2240

## 2019-11-21 NOTE — ED Triage Notes (Signed)
Patient c/o itchy rash on her arms. Patient states that she was pulling weeds out of her yard on 11/15/19.

## 2021-03-17 ENCOUNTER — Ambulatory Visit
Admission: EM | Admit: 2021-03-17 | Discharge: 2021-03-17 | Disposition: A | Discharge status: Home / Self Care | Payer: Medicare Other | Attending: Family Medicine | Admitting: Family Medicine

## 2021-03-17 ENCOUNTER — Encounter: Payer: Self-pay | Admitting: Emergency Medicine

## 2021-03-17 ENCOUNTER — Other Ambulatory Visit: Payer: Self-pay

## 2021-03-17 DIAGNOSIS — J40 Bronchitis, not specified as acute or chronic: Secondary | ICD-10-CM

## 2021-03-17 MED ORDER — AZITHROMYCIN 250 MG PO TABS: ORAL_TABLET | ORAL | 0 refills | Status: AC

## 2021-03-17 MED ORDER — PROMETHAZINE-DM 6.25-15 MG/5ML PO SYRP: 5.0000 mL | ORAL_SOLUTION | Freq: Four times a day (QID) | ORAL | 0 refills | Status: AC | PRN

## 2021-03-17 NOTE — ED Provider Notes (Signed)
[facility]    CSN: [id number] Arrival date & time: 03/17/21  1202      History   Chief Complaint Chief Complaint  Patient presents with   Cough    HPI 65 year old female presents with the above complaints.  Patient reports she has had symptoms over the past week.  Seems to be worsening.  She reports cough and congestion.  Her cough is productive of green sputum.  No fever.  No reported sick contacts.  No relieving factors.  She has not taken any medication or tried any interventions.  No other associated symptoms.  No other complaints.  Home Medications    Prior to Admission medications   Medication Sig Start Date End Date Taking? Authorizing Provider  azithromycin (ZITHROMAX) 250 MG tablet 2 tablets on day 1, then 1 tablet daily on days 2-5. 03/17/21  Yes Alka Falwell G, DO  promethazine-dextromethorphan (PROMETHAZINE-DM) 6.25-15 MG/5ML syrup Take 5 mLs by mouth 4 (four) times daily as needed for cough. 03/17/21  Yes Coral Spikes, DO    Family History Family History  Problem Relation Age of Onset   Cancer Father     Social History Social History   Tobacco Use   Smoking status: Former    Packs/day: 0.50    Types: Cigarettes    Quit date: 08/05/2017    Years since quitting: 3.6   Smokeless tobacco: Never  Vaping Use   Vaping Use: Never used  Substance Use Topics   Alcohol use: Yes   Drug use: No     Allergies   Patient has no known allergies.   Review of Systems Review of Systems Per HPI  Physical Exam Triage Vital Signs ED Triage Vitals  Enc Vitals Group     BP 03/17/21 1235 124/61     Pulse Rate 03/17/21 1235 63     Resp 03/17/21 1235 15     Temp 03/17/21 1235 98.4 F (36.9 C)     Temp Source 03/17/21 1235 Oral     SpO2 03/17/21 1235 99 %     Weight 03/17/21 1232 120 lb (54.4 kg)     Height 03/17/21 1232 '5\' 10"'$  (1.778 m)     Head Circumference --      Peak Flow --      Pain Score 03/17/21 1232 0     Pain Loc --      Pain Edu? --       Excl. in [place]? --    Updated Vital Signs BP 124/61 (BP Location: Left Arm)   Pulse 63   Temp 98.4 F (36.9 C) (Oral)   Resp 15   Ht '5\' 10"'$  (1.778 m)   Wt 54.4 kg   SpO2 99%   BMI 17.22 kg/m   Visual Acuity Right Eye Distance:   Left Eye Distance:   Bilateral Distance:    Right Eye Near:   Left Eye Near:    Bilateral Near:     Physical Exam Vitals and nursing note reviewed.  Constitutional:      General: She is not in acute distress.    Appearance: Normal appearance. She is not ill-appearing.  HENT:     Head: Normocephalic and atraumatic.  Eyes:     General:        Right eye: No discharge.        Left eye: No discharge.     Conjunctiva/sclera: Conjunctivae normal.  Cardiovascular:     Rate and Rhythm: Normal rate and regular  rhythm.  Pulmonary:     Effort: Pulmonary effort is normal.     Breath sounds: Normal breath sounds. No wheezing, rhonchi or rales.  Neurological:     Mental Status: She is alert.  Psychiatric:        Mood and Affect: Mood normal.        Behavior: Behavior normal.    UC Treatments / Results  Labs (all labs ordered are listed, but only abnormal results are displayed) Labs Reviewed - No data to display  EKG   Radiology No results found.  Procedures Procedures (including critical care time)  Medications Ordered in UC Medications - No data to display  Initial Impression / Assessment and Plan / UC Course  I have reviewed the triage vital signs and the nursing notes.  Pertinent labs & imaging results that were available during my care of the patient were reviewed by me and considered in my medical decision making (see chart for details).    65 year old female presents with bronchitis.  Treated with azithromycin and Promethazine DM  Final Clinical Impressions(s) / UC Diagnoses   Final diagnoses:  Bronchitis   Discharge Instructions   None    ED Prescriptions     Medication Sig Dispense Auth. Provider   azithromycin  (ZITHROMAX) 250 MG tablet 2 tablets on day 1, then 1 tablet daily on days 2-5. 6 tablet Mariya Mottley G, DO   promethazine-dextromethorphan (PROMETHAZINE-DM) 6.25-15 MG/5ML syrup Take 5 mLs by mouth 4 (four) times daily as needed for cough. 118 mL Coral Spikes, DO      PDMP not reviewed this encounter.   Coral Spikes, [place] 03/17/21 1610

## 2021-03-17 NOTE — ED Triage Notes (Signed)
Patient c/o cough and chest congestion for a week.  Patient states that she is able to cough up green sputum for the past 2 days.  Patient denies fevers.
